# Patient Record
Sex: Male | Born: 1995 | Race: Black or African American | Hispanic: No | Marital: Single | Smoking: Former smoker
Health system: Southern US, Community
[De-identification: ages and names within clinical notes are randomized; demographics above are authoritative.]

## PROBLEM LIST (undated history)

## (undated) HISTORY — PX: TONSILLECTOMY AND ADENOIDECTOMY: SUR1326

---
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
## 2015-12-11 ENCOUNTER — Emergency Department: Payer: No Typology Code available for payment source

## 2015-12-11 ENCOUNTER — Emergency Department
Admission: EM | Admit: 2015-12-11 | Discharge: 2015-12-11 | Disposition: A | Discharge status: Home / Self Care | Payer: No Typology Code available for payment source | Attending: Emergency Medicine | Admitting: Emergency Medicine

## 2015-12-11 DIAGNOSIS — M25511 Pain in right shoulder: Secondary | ICD-10-CM | POA: Diagnosis present

## 2015-12-11 DIAGNOSIS — S46911A Strain of unspecified muscle, fascia and tendon at shoulder and upper arm level, right arm, initial encounter: Secondary | ICD-10-CM | POA: Diagnosis not present

## 2015-12-11 DIAGNOSIS — Y999 Unspecified external cause status: Secondary | ICD-10-CM | POA: Insufficient documentation

## 2015-12-11 DIAGNOSIS — Y92410 Unspecified street and highway as the place of occurrence of the external cause: Secondary | ICD-10-CM | POA: Diagnosis not present

## 2015-12-11 DIAGNOSIS — Y9389 Activity, other specified: Secondary | ICD-10-CM | POA: Insufficient documentation

## 2015-12-11 DIAGNOSIS — S161XXA Strain of muscle, fascia and tendon at neck level, initial encounter: Secondary | ICD-10-CM | POA: Insufficient documentation
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
## 2015-12-11 MED ORDER — IBUPROFEN 800 MG PO TABS: 800.0000 mg | ORAL_TABLET | Freq: Three times a day (TID) | ORAL | Status: AC | PRN

## 2015-12-11 MED ORDER — CYCLOBENZAPRINE HCL 10 MG PO TABS: 10.0000 mg | ORAL_TABLET | Freq: Three times a day (TID) | ORAL | Status: AC | PRN
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
## 2015-12-11 NOTE — Discharge Instructions (Signed)
Cervical Sprain A cervical sprain is when the tissues (ligaments) that hold the neck bones in place stretch or tear. HOME CARE   Put ice on the injured area.  Put ice in a plastic bag.  Place a towel between your skin and the bag.  Leave the ice on for 15-20 minutes, 3-4 times a day.  You may have been given a collar to wear. This collar keeps your neck from moving while you heal.  Do not take the collar off unless told by your doctor.  If you have long hair, keep it outside of the collar.  Ask your doctor before changing the position of your collar. You may need to change its position over time to make it more comfortable.  If you are allowed to take off the collar for cleaning or bathing, follow your doctor's instructions on how to do it safely.  Keep your collar clean by wiping it with mild soap and water. Dry it completely. If the collar has removable pads, remove them every 1-2 days to hand wash them with soap and water. Allow them to air dry. They should be dry before you wear them in the collar.  Do not drive while wearing the collar.  Only take medicine as told by your doctor.  Keep all doctor visits as told.  Keep all physical therapy visits as told.  Adjust your work station so that you have good posture while you work.  Avoid positions and activities that make your problems worse.  Warm up and stretch before being active. GET HELP IF:  Your pain is not controlled with medicine.  You cannot take less pain medicine over time as planned.  Your activity level does not improve as expected. GET HELP RIGHT AWAY IF:   You are bleeding.  Your stomach is upset.  You have an allergic reaction to your medicine.  You develop new problems that you cannot explain.  You lose feeling (become numb) or you cannot move any part of your body (paralysis).  You have tingling or weakness in any part of your body.  Your symptoms get worse. Symptoms include:  Pain,  soreness, stiffness, puffiness (swelling), or a burning feeling in your neck.  Pain when your neck is touched.  Shoulder or upper back pain.  Limited ability to move your neck.  Headache.  Dizziness.  Your hands or arms feel week, lose feeling, or tingle.  Muscle spasms.  Difficulty swallowing or chewing. MAKE SURE YOU:   Understand these instructions.  Will watch your condition.  Will get help right away if you are not doing well or get worse.   This information is not intended to replace advice given to you by your health care provider. Make sure you discuss any questions you have with your health care provider.   Document Released: 02/09/2008 Document Revised: 04/25/2013 Document Reviewed: 02/28/2013 Elsevier Interactive Patient Education 2016 [employer]  Soft Tissue Injury of the Neck A soft tissue injury of the neck may be either blunt or penetrating. A blunt injury does not break the skin. A penetrating injury breaks the skin, creating an open wound. Blunt injuries may happen in several ways. Most involve some type of direct blow to the neck. This can cause serious injury to the windpipe, voice box, cervical spine, or esophagus. In some cases, the injury to the soft tissue can also result in a break (fracture) of the cervical spine.  Soft tissue injuries of the neck require immediate medical care.  Sometimes, you may not notice the signs of injury right away. You may feel fine at first, but the swelling may eventually close off your airway. This could result in a significant or life-threatening injury. This is rare, but it is important to keep in mind with any injury to the neck.  CAUSES  Causes of blunt injury may include:  "Clothesline" injuries. This happens when someone is moving at high speed and runs into a clothesline, outstretched arm, or similar object. This results in a direct injury to the front of the neck. If the airway is blocked, it can cause suffocation  due to lack of oxygen (asphyxiation) or even instant death.  High-energy trauma. This includes injuries from motor vehicle crashes, falling from a great height, or heavy objects falling onto the neck.  Sports-related injuries. Injury to the windpipe and voice box can result from being struck by another player or being struck by an object, such as a baseball, hockey stick, or an outstretched arm.  Strangulation. This type of injury may cause skin trauma, hoarseness of voice, or broken cartilage in the voice box or windpipe. It may also cause a serious airway problem. SYMPTOMS   Bruising.  Pain and tenderness in the neck.  Swelling of the neck and face.  Hoarseness of voice.  Pain or difficulty with swallowing.  Drooling or inability to swallow.  Trouble breathing. This may become worse when lying flat.  Coughing up blood.  High-pitched, harsh, vibratory noise due to partial obstruction of the windpipe (stridor).  Swelling of the upper arms.  Windpipe that appears to be pushed off to one side.  Air in the tissues under the skin of the neck or chest (subcutaneous emphysema). This usually indicates a problem with the normal airway and is a medical emergency. DIAGNOSIS   If possible, your caregiver may ask about the details of how the injury occurred. A detailed exam can help to identify specific areas of the neck that are injured.  Your caregiver may ask for tests to rule out injury of the voice box, airway, or esophagus. This may include X-rays, ultrasounds, CT scans, or MRI scans, depending on the severity of your injury. TREATMENT  If you have an injury to your windpipe or voice box, immediate medical care is required. In almost all cases, hospitalization is necessary. For injuries that do not appear to require surgery, it is helpful to have medical observation for 24 hours. You may be asked to do one or more of the following:  Rest your voice.  Bed rest.  Limit your diet,  depending on the extent of the injury. Follow your caregiver's dietary guidelines. Often, only fluids and soft foods are recommended.  Keep your head raised.  Breathe humidified air.  Take medicines to control infection, reduce swelling, and reduce normal stomach acid. You may also need pain medicine, depending on your injury. For injuries that appear to require surgery, you will need to stay in the hospital. The exact type of procedure needed will depend on your exact injury or injuries.  HOME CARE INSTRUCTIONS   If the skin was broken, keep the wound area clean and dry. Wear your bandage (dressing) and care for your wound as instructed.  Follow your caregiver's advice about your diet.  Follow your caregiver's advice about use of your voice.  Take medicines as directed.  Keep your head and neck at least partially raised (elevated) while recovering. This should also be done while sleeping. SEEK MEDICAL CARE IF:  Your voice becomes weaker.  Your swelling or bruising is not improving as expected. Typically, this takes several days to improve.  You feel that you are having problems with medicines prescribed.  You have drainage from the injury site. This may be a sign that your wound is not healing properly or is infected.  You develop increasing pain or difficulty while swallowing.  You develop an oral temperature of 102 F (38.9 C) or higher. SEEK IMMEDIATE MEDICAL CARE IF:   You cough up blood.  You develop sudden trouble breathing.  You cannot tolerate your oral medicines, or you are unable to swallow.  You develop drooling.  You have new or worsening vomiting.  You develop sudden, new swelling of the neck or face.  You have an oral temperature above 102 F (38.9 C), not controlled by medicine. MAKE SURE YOU:  Understand these instructions.  Will watch your condition.  Will get help right away if you are not doing well or get worse.   This information is not  intended to replace advice given to you by your health care provider. Make sure you discuss any questions you have with your health care provider.   Document Released: 11/30/2007 Document Revised: 11/15/2011 Document Reviewed: 03/12/2015 Elsevier Interactive Patient Education 2016 [employer]  Muscle Strain A muscle strain is an injury that occurs when a muscle is stretched beyond its normal length. Usually a small number of muscle fibers are torn when this happens. Muscle strain is rated in degrees. First-degree strains have the least amount of muscle fiber tearing and pain. Second-degree and third-degree strains have increasingly more tearing and pain.  Usually, recovery from muscle strain takes 1-2 weeks. Complete healing takes 5-6 weeks.  CAUSES  Muscle strain happens when a sudden, violent force placed on a muscle stretches it too far. This may occur with lifting, sports, or a fall.  RISK FACTORS Muscle strain is especially common in athletes.  SIGNS AND SYMPTOMS At the site of the muscle strain, there may be:  Pain.  Bruising.  Swelling.  Difficulty using the muscle due to pain or lack of normal function. DIAGNOSIS  Your health care provider will perform a physical exam and ask about your medical history. TREATMENT  Often, the best treatment for a muscle strain is resting, icing, and applying cold compresses to the injured area.  HOME CARE INSTRUCTIONS   Use the PRICE method of treatment to promote muscle healing during the first 2-3 days after your injury. The PRICE method involves:  Protecting the muscle from being injured again.  Restricting your activity and resting the injured body part.  Icing your injury. To do this, put ice in a plastic bag. Place a towel between your skin and the bag. Then, apply the ice and leave it on from 15-20 minutes each hour. After the third day, switch to moist heat packs.  Apply compression to the injured area with a splint or elastic  bandage. Be careful not to wrap it too tightly. This may interfere with blood circulation or increase swelling.  Elevate the injured body part above the level of your heart as often as you can.  Only take over-the-counter or prescription medicines for pain, discomfort, or fever as directed by your health care provider.  Warming up prior to exercise helps to prevent future muscle strains. SEEK MEDICAL CARE IF:   You have increasing pain or swelling in the injured area.  You have numbness, tingling, or a significant loss of strength  in the injured area. MAKE SURE YOU:   Understand these instructions.  Will watch your condition.  Will get help right away if you are not doing well or get worse.   This information is not intended to replace advice given to you by your health care provider. Make sure you discuss any questions you have with your health care provider.   Document Released: 08/23/2005 Document Revised: 06/13/2013 Document Reviewed: 03/22/2013 Elsevier Interactive Patient Education [employer]

## 2016-03-18 DIAGNOSIS — Z87828 Personal history of other (healed) physical injury and trauma: Secondary | ICD-10-CM | POA: Insufficient documentation

## 2016-03-18 DIAGNOSIS — L709 Acne, unspecified: Secondary | ICD-10-CM | POA: Insufficient documentation

## 2017-01-27 ENCOUNTER — Emergency Department
Admission: EM | Admit: 2017-01-27 | Discharge: 2017-01-27 | Disposition: A | Discharge status: Home / Self Care | Payer: Self-pay | Attending: Student in an Organized Health Care Education/Training Program | Admitting: Student in an Organized Health Care Education/Training Program

## 2017-01-27 ENCOUNTER — Encounter: Payer: Self-pay | Admitting: Emergency Medicine

## 2017-01-27 DIAGNOSIS — J069 Acute upper respiratory infection, unspecified: Secondary | ICD-10-CM | POA: Insufficient documentation

## 2017-01-27 DIAGNOSIS — B9789 Other viral agents as the cause of diseases classified elsewhere: Secondary | ICD-10-CM

## 2017-01-27 MED ORDER — IBUPROFEN 600 MG PO TABS: 600.0000 mg | ORAL_TABLET | Freq: Four times a day (QID) | ORAL | 0 refills | Status: AC | PRN

## 2017-01-27 MED ORDER — PSEUDOEPH-BROMPHEN-DM 30-2-10 MG/5ML PO SYRP: 5.0000 mL | ORAL_SOLUTION | Freq: Four times a day (QID) | ORAL | 0 refills | Status: AC | PRN

## 2017-01-27 NOTE — ED Provider Notes (Signed)
[facility] Emergency Department Provider Note   ____________________________________________   First MD Initiated Contact with Patient 01/27/17 1156     (approximate)  I have reviewed the triage vital signs and the nursing notes.   HISTORY  Chief Complaint Cough    HPI Steven Jackson is a 21 y.o. male patient complain 3-4 days of productive cough. Patient denies any fever associated this cough. Patient also complaining of nasal congestion and postnasal drainage. Patient denies nausea, vomiting, diarrhea. No palliative measures for his complaint.Patient rates his pain 6/10pain is confined to his chest secondary to coughing. Patient state cough increases with laying down.   History reviewed. No pertinent past medical history.  Patient Active Problem List   Diagnosis Date Noted  . Acne 03/18/2016  . H/O head injury 03/18/2016    Past Surgical History:  Procedure Laterality Date  . TONSILLECTOMY AND ADENOIDECTOMY      Prior to Admission medications   Medication Sig Start Date End Date Taking? Authorizing Provider  adapalene (DIFFERIN) 0.1 % gel  11/19/13   [provider]  brompheniramine-pseudoephedrine-DM 30-2-10 MG/5ML syrup Take 5 mLs by mouth 4 (four) times daily as needed. 01/27/17   Joni ReiningSmith, Syanna Remmert K, PA-C  cyclobenzaprine (FLEXERIL) 10 MG tablet Take 1 tablet (10 mg total) by mouth every 8 (eight) hours as needed for muscle spasms. 12/11/15   Beers, Charmayne Sheerharles M, PA-C  ibuprofen (ADVIL,MOTRIN) 600 MG tablet Take 1 tablet (600 mg total) by mouth every 6 (six) hours as needed. 01/27/17   Joni ReiningSmith, Tashari Schoenfelder K, PA-C  ibuprofen (ADVIL,MOTRIN) 800 MG tablet Take 1 tablet (800 mg total) by mouth every 8 (eight) hours as needed. 12/11/15   Beers, Charmayne Sheerharles M, PA-C    Allergies Patient has no known allergies.  History reviewed. No pertinent family history.  Social History Social History  Substance Use Topics  . Smoking status: Never Smoker  .  Smokeless tobacco: Not on file  . Alcohol use No    Review of Systems  Constitutional: No fever/chills Eyes: No visual changes. ENT: No sore throat. Cardiovascular: Denies chest pain. Respiratory: Denies shortness of breath. Dr. cough Gastrointestinal: No abdominal pain.  No nausea, no vomiting.  No diarrhea.  No constipation. Genitourinary: Negative for dysuria. Musculoskeletal: Chest wall pain secondary to cough Skin: Negative for rash. Neurological: Negative for headaches, focal weakness or numbness.   ____________________________________________   PHYSICAL EXAM:  VITAL SIGNS: ED Triage Vitals  Enc Vitals Group     BP 01/27/17 1126 124/63     Pulse Rate 01/27/17 1126 (!) 58     Resp 01/27/17 1126 18     Temp 01/27/17 1126 98.2 F (36.8 C)     Temp Source 01/27/17 1126 Oral     SpO2 01/27/17 1126 100 %     Weight 01/27/17 1126 155 lb (70.3 kg)     Height 01/27/17 1126 6\' 1"  (1.854 m)     Head Circumference --      Peak Flow --      Pain Score 01/27/17 1125 6     Pain Loc --      Pain Edu? --      Excl. in GC? --     Constitutional: Alert and oriented. Well appearing and in no acute distress. Eyes: Conjunctivae are normal. PERRL. EOMI. Head: Atraumatic. Nose: Nasal congestion Mouth/Throat: Mucous membranes are moist.  Oropharynx non-erythematous. Postnasal drainage tract Neck: No stridor.  No cervical spine tenderness to palpation. Hematological/Lymphatic/Immunilogical: No cervical lymphadenopathy.  Cardiovascular: Normal rate, regular rhythm. Grossly normal heart sounds.  Good peripheral circulation. Respiratory: Normal respiratory effort.  No retractions. Lungs CTAB. Skin:  Skin is warm, dry and intact. No rash noted. Psychiatric: Mood and affect are normal. Speech and behavior are normal.  ____________________________________________   LABS (all labs ordered are listed, but only abnormal results are displayed)  Labs Reviewed - No data to  display ____________________________________________  EKG   ____________________________________________  RADIOLOGY   ____________________________________________   PROCEDURES  Procedure(s) performed: None  Procedures  Critical Care performed: No  ____________________________________________   INITIAL IMPRESSION / ASSESSMENT AND PLAN / ED COURSE  Pertinent labs & imaging results that were available during my care of the patient were reviewed by me and considered in my medical decision making (see chart for details).  Viral upper respiratory infection. Patient given discharge care instruction. Patient advised [facility].      ____________________________________________   FINAL CLINICAL IMPRESSION(S) / ED DIAGNOSES  Final diagnoses:  Viral URI with cough      NEW MEDICATIONS STARTED DURING THIS VISIT:  New Prescriptions   BROMPHENIRAMINE-PSEUDOEPHEDRINE-DM 30-2-10 MG/5ML SYRUP    Take 5 mLs by mouth 4 (four) times daily as needed.   IBUPROFEN (ADVIL,MOTRIN) 600 MG TABLET    Take 1 tablet (600 mg total) by mouth every 6 (six) hours as needed.     Note:  This document was prepared using Dragon voice recognition software and may include unintentional dictation errors.    Joni Reining, PA-C 01/27/17 1209    Willy Eddy, MD 01/28/17 0430

## 2017-01-27 NOTE — ED Triage Notes (Signed)
Pt c/o "wet" cough X 3-4 days. Denies fevers.  Ambulatory to triage. NAD. VSS.  No dyspnea noted.  Has had some nasal congestion.

## 2017-10-23 ENCOUNTER — Emergency Department: Payer: Self-pay

## 2017-10-23 ENCOUNTER — Encounter: Payer: Self-pay | Admitting: Emergency Medicine

## 2017-10-23 ENCOUNTER — Emergency Department
Admission: EM | Admit: 2017-10-23 | Discharge: 2017-10-23 | Discharge status: Against Medical Advice | Payer: Self-pay | Attending: Emergency Medicine | Admitting: Emergency Medicine

## 2017-10-23 DIAGNOSIS — F17200 Nicotine dependence, unspecified, uncomplicated: Secondary | ICD-10-CM | POA: Insufficient documentation

## 2017-10-23 DIAGNOSIS — R059 Cough, unspecified: Secondary | ICD-10-CM

## 2017-10-23 DIAGNOSIS — Z5320 Procedure and treatment not carried out because of patient's decision for unspecified reasons: Secondary | ICD-10-CM | POA: Insufficient documentation

## 2017-10-23 DIAGNOSIS — R05 Cough: Secondary | ICD-10-CM | POA: Insufficient documentation

## 2017-10-23 LAB — CBC
HCT: 42.7 % (ref 40.0–52.0)
Hemoglobin: 14 g/dL (ref 13.0–18.0)
MCH: 28.7 pg (ref 26.0–34.0)
MCHC: 32.9 g/dL (ref 32.0–36.0)
MCV: 87.2 fL (ref 80.0–100.0)
PLATELETS: 218 10*3/uL (ref 150–440)
RBC: 4.9 MIL/uL (ref 4.40–5.90)
RDW: 13.9 % (ref 11.5–14.5)
WBC: 15.9 10*3/uL — AB (ref 3.8–10.6)

## 2017-10-23 LAB — BASIC METABOLIC PANEL
ANION GAP: 7 (ref 5–15)
BUN: 9 mg/dL (ref 6–20)
CALCIUM: 8.9 mg/dL (ref 8.9–10.3)
CHLORIDE: 105 mmol/L (ref 101–111)
CO2: 26 mmol/L (ref 22–32)
CREATININE: 0.81 mg/dL (ref 0.61–1.24)
GFR calc non Af Amer: >60 mL/min (ref >60)
GLUCOSE: 79 mg/dL (ref 65–99)
Potassium: 3.5 mmol/L (ref 3.5–5.1)
Sodium: 138 mmol/L (ref 135–145)

## 2017-10-23 LAB — TROPONIN I: Troponin I: <0.03 ng/mL (ref <0.03)

## 2017-10-23 IMAGING — CR DG CHEST 2V
1 series · 2 of 2 positions shown · non-contrast
Comparison: None.

CLINICAL DATA: Cough

EXAM:
CHEST  2 VIEW

[Series 1: dg chest 2 view · 0.14mm/px · 2 of 2 slices shown]
[im 1/2]
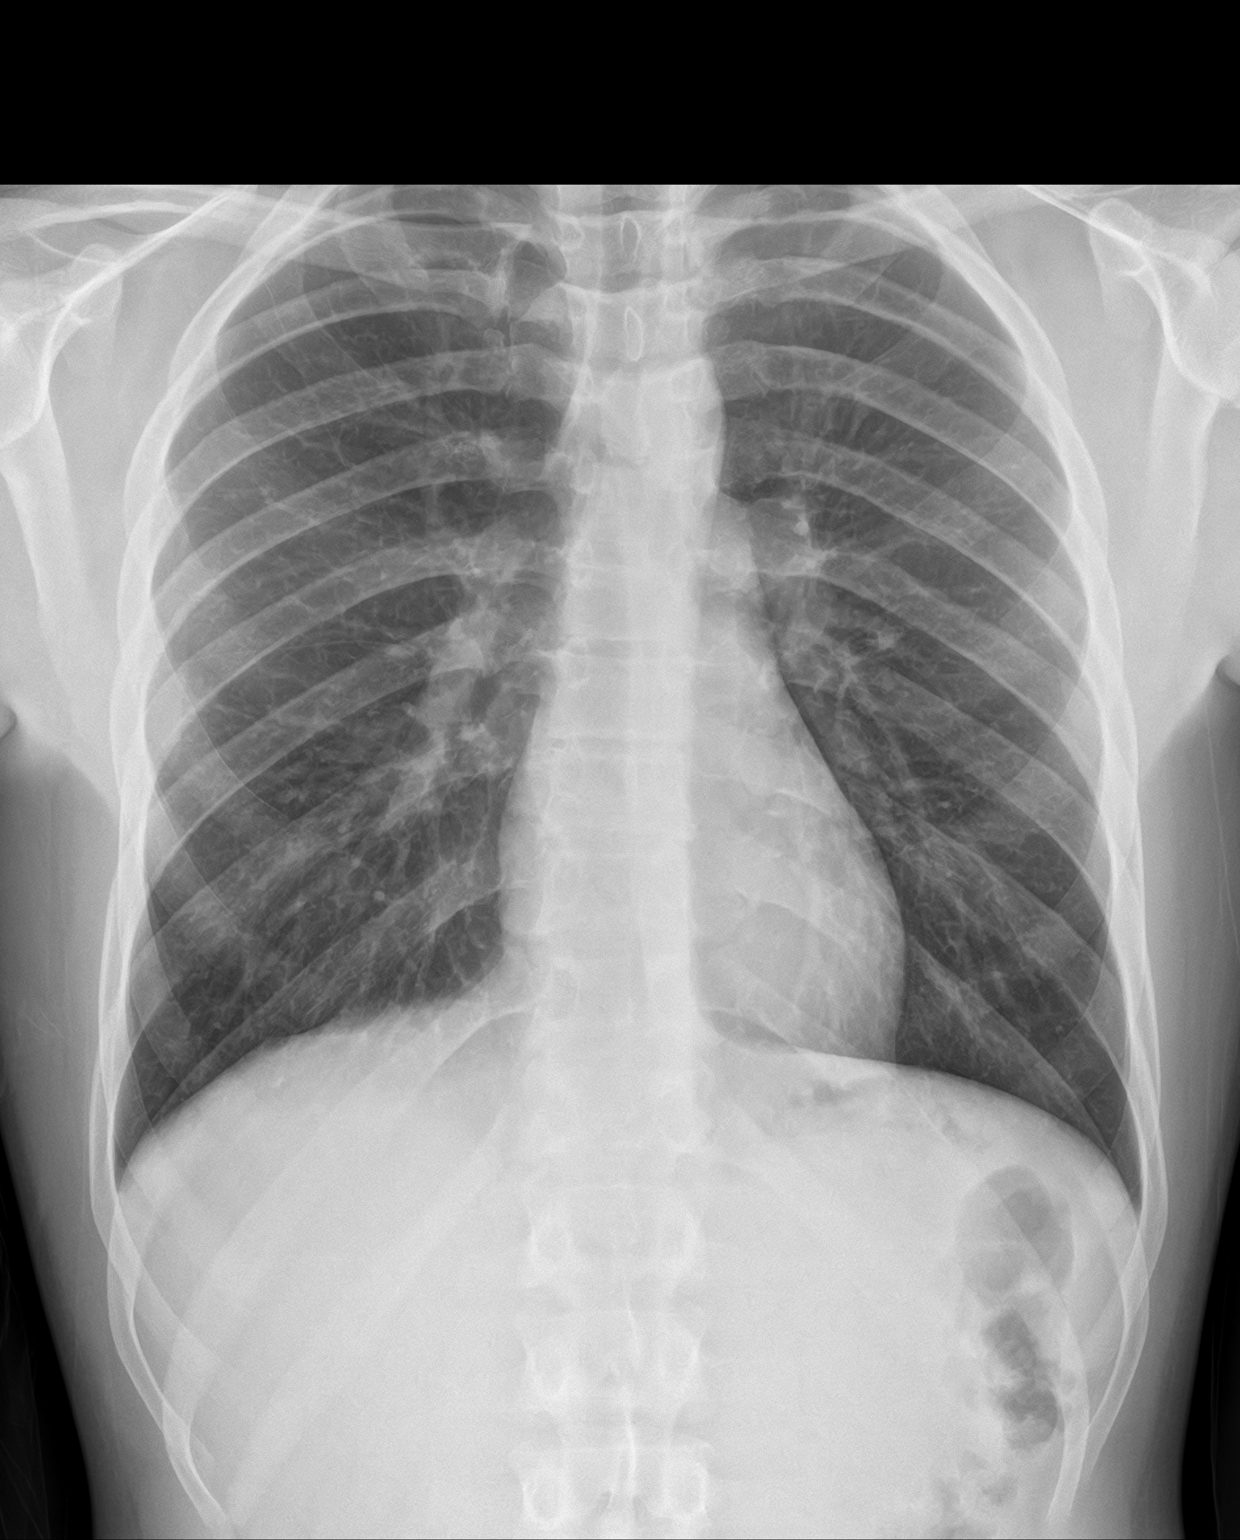
[im 2/2]
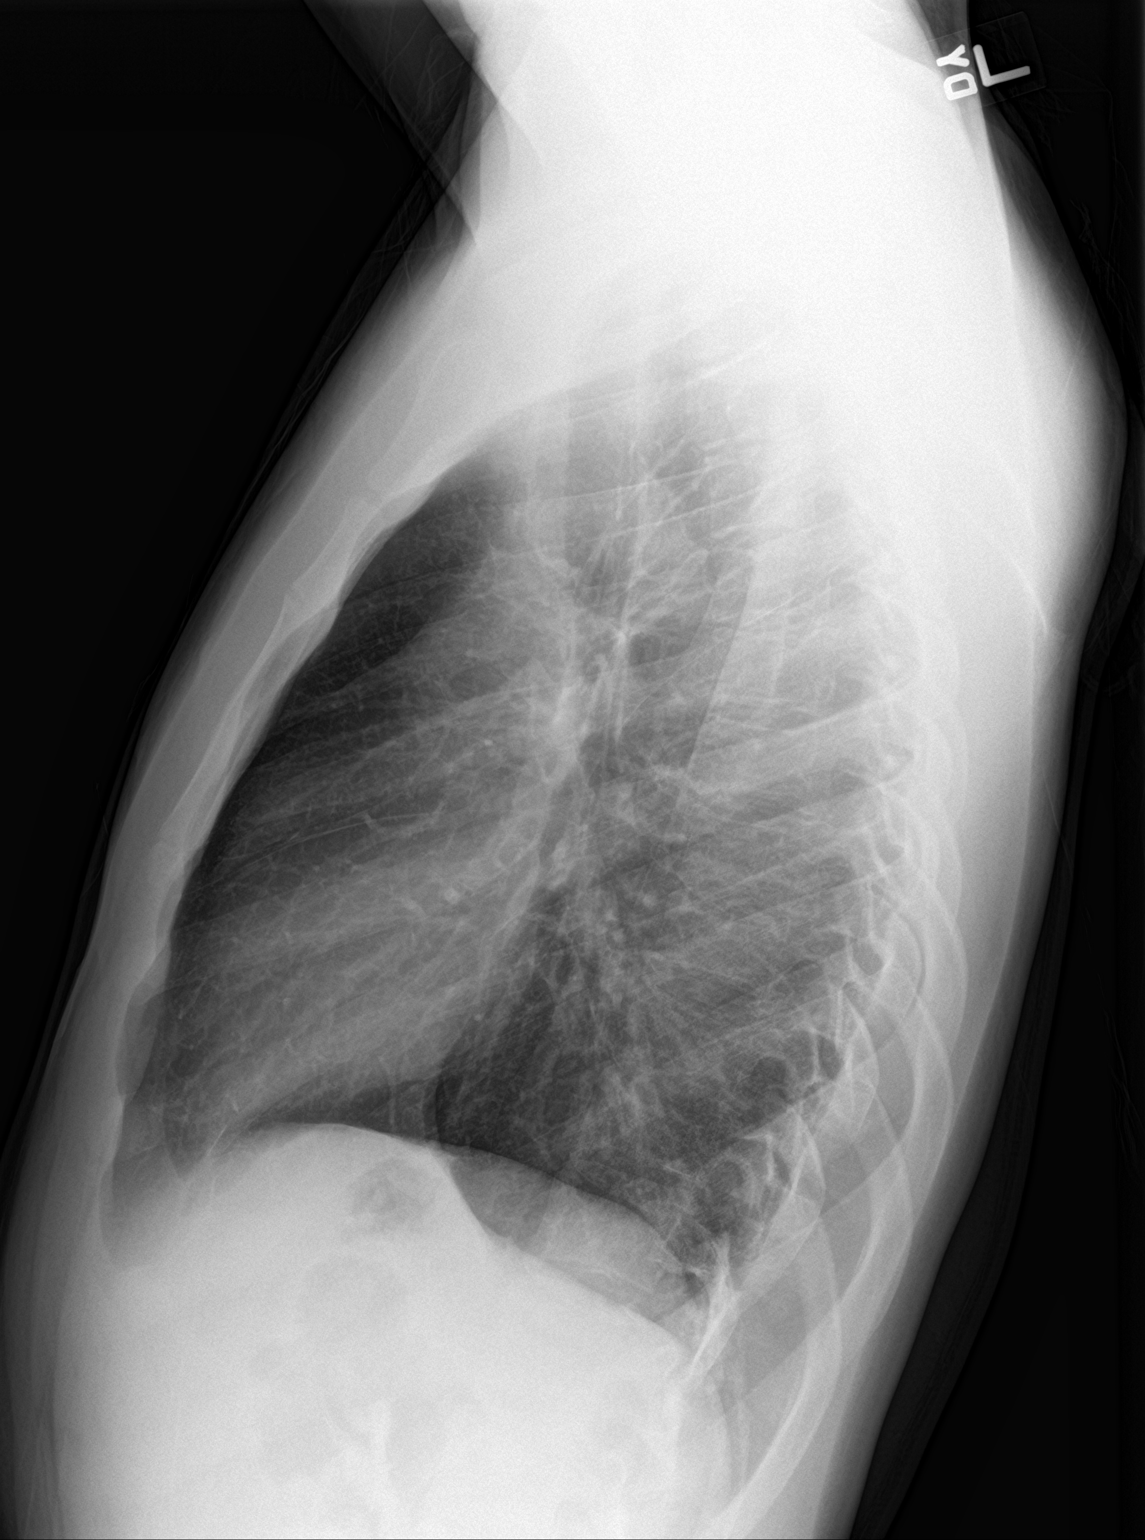

[2 of 2 positions shown; findings below may reference images not displayed]

FINDINGS: The heart size and mediastinal contours are within normal limits.
Both lungs are clear. The visualized skeletal structures are
unremarkable.
IMPRESSION: No active cardiopulmonary disease.

## 2017-10-23 MED ORDER — IPRATROPIUM-ALBUTEROL 0.5-2.5 (3) MG/3ML IN SOLN
3.0000 mL | Freq: Once | RESPIRATORY_TRACT | Status: DC
  Filled 2017-10-23: qty 3

## 2017-10-23 NOTE — ED Notes (Signed)
Pt reports chest pain with cough or deep inhalation, pt denies meds, medical hx or surgery  Reports dry cough that keeps pt up at night and malaise  Pt denies N/V/D/diaphoresis or pain radiation

## 2017-10-23 NOTE — ED Triage Notes (Signed)
Pt arrived with mom with complains of chest pain and cough that started a week ago. Pt appears pale in triage and weak. Pt states the chest pain is in the center of his chest and feels like pressure. Pt has dry non productive cough.

## 2017-10-23 NOTE — ED Provider Notes (Signed)
[facility] Emergency Department Provider Note   ____________________________________________   I have reviewed the triage vital signs and the nursing notes.   HISTORY  Chief Complaint Cough  History limited by: Not Limited   HPI Steven Jackson is a 22 y.o. male who [facility] the emergency department [facility] for [facility].  It has been present for 1 week.  It is dry.  It is slightly worse at night.  The patient states that since he started his cough is developed chest pain.  Is located primarily in the center of his chest.  Present when he coughs.  States that he is felt weak as well.  He denies any associated fevers.  States he only smokes occasionally.  Denies any history of lung disease in the past.  History reviewed. No pertinent past medical history.  Patient Active Problem List   Diagnosis Date Noted  . Acne 03/18/2016  . H/O head injury 03/18/2016    Past Surgical History:  Procedure Laterality Date  . TONSILLECTOMY AND ADENOIDECTOMY      Prior to Admission medications   Medication Sig Start Date End Date Taking? Authorizing Provider  adapalene (DIFFERIN) 0.1 % gel  11/19/13   [provider]  brompheniramine-pseudoephedrine-DM 30-2-10 MG/5ML syrup Take 5 mLs by mouth 4 (four) times daily as needed. 01/27/17   Joni ReiningSmith, Ronald K, PA-C  cyclobenzaprine (FLEXERIL) 10 MG tablet Take 1 tablet (10 mg total) by mouth every 8 (eight) hours as needed for muscle spasms. 12/11/15   Beers, Charmayne Sheerharles M, PA-C  ibuprofen (ADVIL,MOTRIN) 600 MG tablet Take 1 tablet (600 mg total) by mouth every 6 (six) hours as needed. 01/27/17   Joni ReiningSmith, Ronald K, PA-C  ibuprofen (ADVIL,MOTRIN) 800 MG tablet Take 1 tablet (800 mg total) by mouth every 8 (eight) hours as needed. 12/11/15   Beers, Charmayne Sheerharles M, PA-C    Allergies Patient has no known allergies.  No family history on file.  Social History Social History   Tobacco Use  . Smoking status: Light  Tobacco Smoker  . Smokeless tobacco: Never Used  Substance Use Topics  . Alcohol use: No  . Drug use: Not on file    Review of Systems Constitutional: No fever/chills Eyes: No visual changes. ENT: No sore throat. Cardiovascular: Denies chest pain. Respiratory: Positive for shortness of breath and cough. Gastrointestinal: No abdominal pain.  No nausea, no vomiting.  No diarrhea.   Genitourinary: Negative for dysuria. Musculoskeletal: Negative for back pain. Skin: Negative for rash. Neurological: Negative for headaches, focal weakness or numbness.  ____________________________________________   PHYSICAL EXAM:  VITAL SIGNS: ED Triage Vitals [10/23/17 1801]  Enc Vitals Group     BP 121/69     Pulse Rate 85     Resp 18     Temp 99.7 F (37.6 C)     Temp Source Oral     SpO2 100 %     Weight 151 lb (68.5 kg)     Height 6\' 2"  (1.88 m)     Head Circumference      Peak Flow      Pain Score 7    Constitutional: Alert and oriented. Well appearing and in no distress. Eyes: Conjunctivae are normal.  ENT   Head: Normocephalic and atraumatic.   Nose: No congestion/rhinnorhea.   Mouth/Throat: Mucous membranes are moist.   Neck: No stridor. Hematological/Lymphatic/Immunilogical: No cervical lymphadenopathy. Cardiovascular: Normal rate, regular rhythm.  No murmurs, rubs, or gallops.  Respiratory: Normal respiratory  effort without tachypnea nor retractions. Breath sounds are clear and equal bilaterally. No wheezes/rales/rhonchi. Gastrointestinal: Soft and non tender. No rebound. No guarding.  Genitourinary: Deferred Musculoskeletal: Normal range of motion in all extremities. No lower extremity edema. Neurologic:  Normal speech and language. No gross focal neurologic deficits are appreciated.  Skin:  Skin is warm, dry and intact. No rash noted. Psychiatric: Mood and affect are normal. Speech and behavior are normal. Patient exhibits appropriate insight and  judgment.  ____________________________________________    LABS (pertinent positives/negatives)  Trop <0.03 CBC wbc 15.9, hgb 14.0, plt 218 BMP wnl  ____________________________________________   EKG  I, Phineas Semen, attending physician, personally viewed and interpreted this EKG  EKG Time: 1758 Rate: 82 Rhythm: normal sinus rhythm Axis: normal Intervals: qtc 413 QRS: narrow ST changes: no st elevation Impression: normal ekg  ____________________________________________    RADIOLOGY  CXR No acute disease   ____________________________________________   PROCEDURES  Procedures  ____________________________________________   INITIAL IMPRESSION / ASSESSMENT AND PLAN / ED COURSE  Pertinent labs & imaging results that were available during my care of the patient were reviewed by me and considered in my medical decision making (see chart for details).  Patient presents to the emergency department today with concerns for cough for roughly 1 week's time.  Differential would be broad including upper respiratory infection, pneumonia, pneumothorax, asthma amongst other differentials.  Patient's x-ray and blood work without concerning findings.  On exam no wheezing rhonchi or crackles. Did discuss with patient that likely bronchitis and that we would try breathing treatment here to see if it would help alleviate the patient's symptoms. The patient however left the emergency department prior to receiving the treatment and without telling anyone.   ____________________________________________   FINAL CLINICAL IMPRESSION(S) / ED DIAGNOSES  Final diagnoses:  Cough     Note: This dictation was prepared with Dragon dictation. Any transcriptional errors that result from this process are unintentional     Phineas Semen, MD 10/23/17 2143

## 2017-10-23 NOTE — ED Notes (Signed)
Pt not in room.

## 2017-10-23 NOTE — ED Notes (Addendum)
Pt called out front and charge, [facility] [employer], RN and EDP notified - will document elopement as AMA

## 2019-04-19 ENCOUNTER — Other Ambulatory Visit: Payer: Self-pay

## 2019-04-19 ENCOUNTER — Ambulatory Visit: Payer: Self-pay | Admitting: Physician Assistant

## 2019-04-19 ENCOUNTER — Encounter: Payer: Self-pay | Admitting: Physician Assistant

## 2019-04-19 DIAGNOSIS — Z202 Contact with and (suspected) exposure to infections with a predominantly sexual mode of transmission: Secondary | ICD-10-CM

## 2019-04-19 DIAGNOSIS — Z113 Encounter for screening for infections with a predominantly sexual mode of transmission: Secondary | ICD-10-CM

## 2019-04-19 MED ORDER — AZITHROMYCIN 500 MG PO TABS
1000.0000 mg | ORAL_TABLET | Freq: Once | ORAL | Status: AC
  Administered 2019-04-19: 1000 mg via ORAL

## 2019-04-19 NOTE — Progress Notes (Signed)
Patient here for treatment as contact to Chlamydia, also desires HIV&Syphilis testing.Jenetta Downer, RN
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
## 2019-06-11 ENCOUNTER — Emergency Department: Payer: Self-pay

## 2019-06-11 ENCOUNTER — Other Ambulatory Visit: Payer: Self-pay

## 2019-06-11 ENCOUNTER — Emergency Department
Admission: EM | Admit: 2019-06-11 | Discharge: 2019-06-11 | Disposition: A | Discharge status: Home / Self Care | Payer: Self-pay | Attending: Emergency Medicine | Admitting: Emergency Medicine

## 2019-06-11 DIAGNOSIS — Y999 Unspecified external cause status: Secondary | ICD-10-CM | POA: Insufficient documentation

## 2019-06-11 DIAGNOSIS — Y929 Unspecified place or not applicable: Secondary | ICD-10-CM | POA: Insufficient documentation

## 2019-06-11 DIAGNOSIS — S0083XA Contusion of other part of head, initial encounter: Secondary | ICD-10-CM

## 2019-06-11 DIAGNOSIS — S01312A Laceration without foreign body of left ear, initial encounter: Secondary | ICD-10-CM | POA: Insufficient documentation

## 2019-06-11 DIAGNOSIS — S022XXB Fracture of nasal bones, initial encounter for open fracture: Secondary | ICD-10-CM

## 2019-06-11 DIAGNOSIS — S0181XA Laceration without foreign body of other part of head, initial encounter: Secondary | ICD-10-CM

## 2019-06-11 DIAGNOSIS — S0101XA Laceration without foreign body of scalp, initial encounter: Secondary | ICD-10-CM | POA: Insufficient documentation

## 2019-06-11 DIAGNOSIS — Y9389 Activity, other specified: Secondary | ICD-10-CM | POA: Insufficient documentation
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
## 2019-06-11 MED ORDER — HYDROMORPHONE HCL 1 MG/ML IJ SOLN
1.0000 mg | Freq: Once | INTRAMUSCULAR | Status: AC
  Administered 2019-06-11: 1 mg via INTRAVENOUS
  Filled 2019-06-11: qty 1

## 2019-06-11 MED ORDER — AMOXICILLIN-POT CLAVULANATE 875-125 MG PO TABS: 1.0000 | ORAL_TABLET | Freq: Two times a day (BID) | ORAL | 0 refills | Status: AC

## 2019-06-11 MED ORDER — LIDOCAINE HCL (PF) 1 % IJ SOLN: 5.0000 mL | Freq: Once | INTRAMUSCULAR | Status: DC

## 2019-06-11 MED ORDER — BACITRACIN-NEOMYCIN-POLYMYXIN 400-5-5000 EX OINT
TOPICAL_OINTMENT | Freq: Two times a day (BID) | CUTANEOUS | Status: DC
  Administered 2019-06-11: via TOPICAL
  Filled 2019-06-11: qty 1

## 2019-06-11 MED ORDER — OXYCODONE-ACETAMINOPHEN 5-325 MG PO TABS: 1.0000 | ORAL_TABLET | ORAL | 0 refills | Status: AC | PRN

## 2019-06-11 NOTE — ED Notes (Signed)
ED Provider at bedside. 

## 2019-06-11 NOTE — ED Notes (Signed)
Patient transported to CT 
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
## 2019-06-11 NOTE — ED Notes (Signed)
Patient in bed talking on phone with mother at bedside.

## 2019-06-11 NOTE — ED Notes (Signed)
PA student at bedside cleaning patient's LAC's.

## 2019-06-11 NOTE — ED Notes (Signed)
Patient went to restroom 

## 2019-06-11 NOTE — Discharge Instructions (Addendum)
Please seek medical attention for any high fevers, chest pain, shortness of breath, change in behavior, persistent vomiting, bloody stool or any other new or concerning symptoms.  
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
